# Patient Record
Sex: Male | Born: 1985 | Race: White | Hispanic: No | Marital: Single | Smoking: Current every day smoker
Health system: Southern US, Community
[De-identification: ages and names within clinical notes are randomized; demographics above are authoritative.]

---

## 2005-06-01 ENCOUNTER — Inpatient Hospital Stay: Payer: Self-pay | Admitting: Infectious Diseases

## 2005-06-01 ENCOUNTER — Other Ambulatory Visit: Payer: Self-pay

## 2005-06-04 ENCOUNTER — Inpatient Hospital Stay: Payer: Self-pay | Admitting: Unknown Physician Specialty

## 2005-10-28 ENCOUNTER — Emergency Department: Payer: Self-pay | Admitting: Emergency Medicine

## 2006-01-01 ENCOUNTER — Emergency Department: Payer: Self-pay | Admitting: Emergency Medicine

## 2006-03-05 ENCOUNTER — Emergency Department: Payer: Self-pay | Admitting: Emergency Medicine

## 2006-11-11 ENCOUNTER — Emergency Department: Payer: Self-pay | Admitting: Emergency Medicine

## 2007-05-06 ENCOUNTER — Ambulatory Visit: Payer: Self-pay | Admitting: Emergency Medicine

## 2007-06-28 ENCOUNTER — Emergency Department: Payer: Self-pay | Admitting: Emergency Medicine

## 2008-12-08 ENCOUNTER — Emergency Department: Payer: Self-pay | Admitting: Emergency Medicine

## 2009-05-12 ENCOUNTER — Emergency Department: Payer: Self-pay | Admitting: Emergency Medicine

## 2011-01-21 ENCOUNTER — Emergency Department: Payer: Self-pay | Admitting: Emergency Medicine

## 2011-07-06 ENCOUNTER — Inpatient Hospital Stay: Payer: Self-pay | Admitting: Psychiatry

## 2012-03-08 ENCOUNTER — Emergency Department: Payer: Self-pay | Admitting: Emergency Medicine

## 2012-03-08 LAB — COMPREHENSIVE METABOLIC PANEL
Alkaline Phosphatase: 75 U/L (ref 50–136)
Anion Gap: 7 (ref 7–16)
BUN: 12 mg/dL (ref 7–18)
Bilirubin,Total: 0.6 mg/dL (ref 0.2–1.0)
Calcium, Total: 9.1 mg/dL (ref 8.5–10.1)
Chloride: 108 mmol/L — ABNORMAL HIGH (ref 98–107)
Co2: 28 mmol/L (ref 21–32)
EGFR (African American): >60
Glucose: 82 mg/dL (ref 65–99)
Osmolality: 284 (ref 275–301)
SGPT (ALT): 185 U/L — ABNORMAL HIGH
Sodium: 143 mmol/L (ref 136–145)

## 2012-03-08 LAB — DRUG SCREEN, URINE
Amphetamines, Ur Screen: NEGATIVE (ref ?–1000)
Barbiturates, Ur Screen: NEGATIVE (ref ?–200)
Benzodiazepine, Ur Scrn: POSITIVE (ref ?–200)
Cannabinoid 50 Ng, Ur ~~LOC~~: POSITIVE (ref ?–50)
MDMA (Ecstasy)Ur Screen: NEGATIVE (ref ?–500)
Opiate, Ur Screen: NEGATIVE (ref ?–300)
Phencyclidine (PCP) Ur S: NEGATIVE (ref ?–25)
Tricyclic, Ur Screen: NEGATIVE (ref ?–1000)

## 2012-03-08 LAB — URINALYSIS, COMPLETE
Bacteria: NONE SEEN
Bilirubin,UR: NEGATIVE
Glucose,UR: NEGATIVE mg/dL (ref 0–75)
Ketone: NEGATIVE
Ph: 7 (ref 4.5–8.0)
RBC,UR: 3 /HPF (ref 0–5)
Squamous Epithelial: NONE SEEN
WBC UR: 1 /HPF (ref 0–5)

## 2012-03-08 LAB — SALICYLATE LEVEL: Salicylates, Serum: <1.7 mg/dL

## 2012-03-08 LAB — ETHANOL: Ethanol: <3 mg/dL

## 2012-03-08 LAB — ACETAMINOPHEN LEVEL: Acetaminophen: <2 ug/mL

## 2012-03-08 LAB — CBC
HCT: 41.6 % (ref 40.0–52.0)
MCH: 31.7 pg (ref 26.0–34.0)
MCHC: 34.1 g/dL (ref 32.0–36.0)

## 2012-05-24 ENCOUNTER — Emergency Department: Payer: Self-pay | Admitting: Emergency Medicine

## 2012-05-24 LAB — COMPREHENSIVE METABOLIC PANEL
Albumin: 4.3 g/dL (ref 3.4–5.0)
Alkaline Phosphatase: 65 U/L (ref 50–136)
Bilirubin,Total: 0.4 mg/dL (ref 0.2–1.0)
Calcium, Total: 9.3 mg/dL (ref 8.5–10.1)
Co2: 30 mmol/L (ref 21–32)
EGFR (Non-African Amer.): >60
Glucose: 112 mg/dL — ABNORMAL HIGH (ref 65–99)
Osmolality: 285 (ref 275–301)
SGOT(AST): 76 U/L — ABNORMAL HIGH (ref 15–37)
SGPT (ALT): 105 U/L — ABNORMAL HIGH
Sodium: 142 mmol/L (ref 136–145)

## 2012-05-24 LAB — URINALYSIS, COMPLETE
Ketone: NEGATIVE
Nitrite: NEGATIVE
Ph: 5 (ref 4.5–8.0)
Protein: NEGATIVE
RBC,UR: 2 /HPF (ref 0–5)
Squamous Epithelial: <1

## 2012-05-24 LAB — DRUG SCREEN, URINE
Amphetamines, Ur Screen: NEGATIVE (ref ?–1000)
Benzodiazepine, Ur Scrn: NEGATIVE (ref ?–200)
Cannabinoid 50 Ng, Ur ~~LOC~~: POSITIVE (ref ?–50)
Methadone, Ur Screen: NEGATIVE (ref ?–300)
Opiate, Ur Screen: NEGATIVE (ref ?–300)
Phencyclidine (PCP) Ur S: NEGATIVE (ref ?–25)
Tricyclic, Ur Screen: NEGATIVE (ref ?–1000)

## 2012-05-24 LAB — CBC
HCT: 41.3 % (ref 40.0–52.0)
HGB: 13.9 g/dL (ref 13.0–18.0)
MCH: 31.5 pg (ref 26.0–34.0)
MCHC: 33.7 g/dL (ref 32.0–36.0)
Platelet: 185 10*3/uL (ref 150–440)
RDW: 12.5 % (ref 11.5–14.5)
WBC: 7.6 10*3/uL (ref 3.8–10.6)

## 2012-05-24 LAB — TSH: Thyroid Stimulating Horm: 1.22 u[IU]/mL

## 2012-05-24 LAB — ETHANOL: Ethanol %: <0.003 % (ref 0.000–0.080)

## 2013-11-24 ENCOUNTER — Emergency Department: Payer: Self-pay | Admitting: Emergency Medicine

## 2013-11-24 LAB — ETHANOL

## 2013-11-24 LAB — URINALYSIS, COMPLETE
Bacteria: NONE SEEN
Bilirubin,UR: NEGATIVE
Blood: NEGATIVE
Glucose,UR: NEGATIVE mg/dL (ref 0–75)
KETONE: NEGATIVE
Nitrite: NEGATIVE
PH: 7 (ref 4.5–8.0)
Protein: NEGATIVE
Specific Gravity: 1.021 (ref 1.003–1.030)
Squamous Epithelial: NONE SEEN
WBC UR: 51 /HPF (ref 0–5)

## 2013-11-24 LAB — COMPREHENSIVE METABOLIC PANEL
ALBUMIN: 4 g/dL (ref 3.4–5.0)
ALT: 38 U/L (ref 12–78)
AST: 31 U/L (ref 15–37)
Alkaline Phosphatase: 69 U/L
Anion Gap: 4 — ABNORMAL LOW (ref 7–16)
BUN: 14 mg/dL (ref 7–18)
Bilirubin,Total: 0.4 mg/dL (ref 0.2–1.0)
CHLORIDE: 108 mmol/L — AB (ref 98–107)
CREATININE: 0.76 mg/dL (ref 0.60–1.30)
Calcium, Total: 8.6 mg/dL (ref 8.5–10.1)
Co2: 25 mmol/L (ref 21–32)
EGFR (African American): >60
EGFR (Non-African Amer.): >60
GLUCOSE: 93 mg/dL (ref 65–99)
Osmolality: 274 (ref 275–301)
Potassium: 4 mmol/L (ref 3.5–5.1)
Sodium: 137 mmol/L (ref 136–145)
Total Protein: 7.4 g/dL (ref 6.4–8.2)

## 2013-11-24 LAB — DRUG SCREEN, URINE
AMPHETAMINES, UR SCREEN: NEGATIVE (ref ?–1000)
Barbiturates, Ur Screen: NEGATIVE (ref ?–200)
Benzodiazepine, Ur Scrn: NEGATIVE (ref ?–200)
Cannabinoid 50 Ng, Ur ~~LOC~~: POSITIVE (ref ?–50)
Cocaine Metabolite,Ur ~~LOC~~: POSITIVE (ref ?–300)
MDMA (ECSTASY) UR SCREEN: NEGATIVE (ref ?–500)
Methadone, Ur Screen: NEGATIVE (ref ?–300)
Opiate, Ur Screen: NEGATIVE (ref ?–300)
Phencyclidine (PCP) Ur S: NEGATIVE (ref ?–25)
Tricyclic, Ur Screen: NEGATIVE (ref ?–1000)

## 2013-11-24 LAB — CBC
HCT: 42.5 % (ref 40.0–52.0)
HGB: 14.9 g/dL (ref 13.0–18.0)
MCH: 31.6 pg (ref 26.0–34.0)
MCHC: 34.9 g/dL (ref 32.0–36.0)
MCV: 91 fL (ref 80–100)
Platelet: 224 10*3/uL (ref 150–440)
RBC: 4.7 10*6/uL (ref 4.40–5.90)
RDW: 13 % (ref 11.5–14.5)
WBC: 12.2 10*3/uL — ABNORMAL HIGH (ref 3.8–10.6)

## 2013-11-24 LAB — ACETAMINOPHEN LEVEL

## 2013-11-24 LAB — TROPONIN I

## 2013-11-24 LAB — SALICYLATE LEVEL: Salicylates, Serum: 2.6 mg/dL

## 2014-06-28 ENCOUNTER — Emergency Department: Payer: Self-pay | Admitting: Emergency Medicine

## 2014-07-10 ENCOUNTER — Emergency Department: Payer: Self-pay | Admitting: Emergency Medicine

## 2016-02-28 ENCOUNTER — Emergency Department
Admission: EM | Admit: 2016-02-28 | Discharge: 2016-02-29 | Disposition: A | Discharge status: Home / Self Care | Payer: Self-pay | Attending: Emergency Medicine | Admitting: Emergency Medicine

## 2016-02-28 DIAGNOSIS — T401X1A Poisoning by heroin, accidental (unintentional), initial encounter: Secondary | ICD-10-CM | POA: Insufficient documentation

## 2016-02-28 NOTE — ED Provider Notes (Signed)
[facility] Emergency Department Provider Note  ____________________________________________  Time seen: 1:55 PM  I have reviewed the triage vital signs and the nursing notes.   HISTORY  Chief Complaint No chief complaint on file.      HPI Ronald Wood is a 30 y.o. male [place] via EMS [facility] [place] with presumed heroin overdose. Patient admits to using IV heroin tonight per EMS they were called for altered mental status.    Past Medical history Opiate abuse There are no active problems to display for this patient.   Past surgical history None No current outpatient prescriptions on file.  Allergies No known drug allergies No family history on file.  Social History Social History  Substance Use Topics  . Smoking status: Not on file  . Smokeless tobacco: Not on file  . Alcohol Use: Not on file    Review of Systems  Constitutional: Negative for fever. Eyes: Negative for visual changes. ENT: Negative for sore throat. Cardiovascular: Negative for chest pain. Respiratory: Negative for shortness of breath. Gastrointestinal: Negative for abdominal pain, vomiting and diarrhea. Genitourinary: Negative for dysuria. Musculoskeletal: Negative for back pain. Skin: Negative for rash. Neurological: Negative for headaches, focal weakness or numbness. Psychiatric:Positive for opiate abuse  10-point ROS otherwise negative.  ____________________________________________   PHYSICAL EXAM:  VITAL SIGNS: ED Triage Vitals  Enc Vitals Group     BP --      Pulse --      Resp --      Temp --      Temp src --      SpO2 --      Weight --      Height --      Head Cir --      Peak Flow --      Pain Score --      Pain Loc --      Pain Edu? --      Excl. in GC? --      Constitutional: Somnolent but easily arousable Eyes: Conjunctivae are normal. PERRL. Normal extraocular movements. ENT   Head: Normocephalic and atraumatic.    Nose: No congestion/rhinnorhea.   Mouth/Throat: Mucous membranes are moist.   Neck: No stridor. Hematological/Lymphatic/Immunilogical: No cervical lymphadenopathy. Cardiovascular: Normal rate, regular rhythm. Normal and symmetric distal pulses are present in all extremities. No murmurs, rubs, or gallops. Respiratory: Normal respiratory effort without tachypnea nor retractions. Breath sounds are clear and equal bilaterally. No wheezes/rales/rhonchi. Gastrointestinal: Soft and nontender. No distention. There is no CVA tenderness. Genitourinary: deferred Musculoskeletal: Nontender with normal range of motion in all extremities. No joint effusions.  No lower extremity tenderness nor edema. Neurologic:  Normal speech and language. No gross focal neurologic deficits are appreciated. Speech is normal.  Skin:  Skin is warm, dry and intact. No rash noted.    INITIAL IMPRESSION / ASSESSMENT AND PLAN / ED COURSE  Pertinent labs & imaging results that were available during my care of the patient were reviewed by me and considered in my medical decision making (see chart for details).  Patient maintaining oxygen saturation at 97% respiratory rate at this time 16. As a result of this patient did not receive Narcan  ____________________________________________   FINAL CLINICAL IMPRESSION(S) / ED DIAGNOSES  Final diagnoses:  Heroin overdose, accidental or unintentional, initial encounter      Darci Currentandolph N Omri Bertran, MD 02/29/16 [phone]

## 2016-02-29 ENCOUNTER — Encounter: Payer: Self-pay | Admitting: Emergency Medicine

## 2016-02-29 NOTE — Discharge Instructions (Signed)
Narcotic Overdose °A narcotic overdose is the misuse or overuse of a narcotic drug. A narcotic overdose can make you pass out and stop breathing. If you are not treated right away, this can cause permanent brain damage or stop your heart. Medicine may be given to reverse the effects of an overdose. If so, this medicine may bring on withdrawal symptoms. The symptoms may be abdominal cramps, throwing up (vomiting), sweating, chills, and nervousness. °Injecting narcotics can cause more problems than just an overdose. AIDS, hepatitis, and other very serious infections are transmitted by sharing needles and syringes. If you decide to quit using, there are medicines which can help you through the withdrawal period. Trying to quit all at once on your own can be uncomfortable, but not life-threatening. Call your caregiver, Narcotics Anonymous, or any drug and alcohol treatment program for further help.  °  °This information is not intended to replace advice given to you by your health care provider. Make sure you discuss any questions you have with your health care provider. °  °Document Released: 12/11/2004 Document Revised: 11/24/2014 Document Reviewed: 04/25/2015 °Elsevier Interactive Patient Education ©2016 [employer] ° °

## 2016-02-29 NOTE — ED Notes (Signed)
Pt woke and started verbally aggressive, using F word and saying he did not need to be here. Pt informed that he was altered and not mentaining O2 saturation well when he arrived here in the ED. Continued to curse then rush out the ED.

## 2016-02-29 NOTE — ED Notes (Signed)
Pt found disoriented, states overdose on heroin.

## 2018-07-30 ENCOUNTER — Emergency Department
Admission: EM | Admit: 2018-07-30 | Discharge: 2018-07-30 | Discharge status: Against Medical Advice | Payer: Managed Care, Other (non HMO) | Attending: Emergency Medicine | Admitting: Emergency Medicine

## 2018-07-30 DIAGNOSIS — F15129 Other stimulant abuse with intoxication, unspecified: Secondary | ICD-10-CM | POA: Insufficient documentation

## 2018-07-30 NOTE — ED Provider Notes (Signed)
-----------------------------------------   12:50 PM on 07/30/2018 -----------------------------------------  I saw the patient in triage as he was threatening to leave.  I spoke to the patient, he does admit to substance use disorder.  However he clearly denies any suicidal homicidal thoughts.  Patient did state 2 days ago he swallowed approximately 2 g of methamphetamine.  Discussed this with him as well as it was in a plastic bag that we would recommend being medically evaluated.  Patient states he understands but does not want to be seen.  Does not want to come into the hospital.  Denies SI or HI and states he is going to leave.  Discussed this with the father as well, father states they have been going through this for a long time and he was initially agreeable to come in for treatment but is now saying that he is no longer agreeable.  As the patient denies SI or HI he does not currently meet commitment criteria and he is free to leave without a proper examination.   Minna AntisPaduchowski, Enolia Koepke, MD 07/30/18 1252

## 2018-07-30 NOTE — ED Notes (Signed)
MD Paduchowski called to Triage for eval. Pt stating he wants to leave but father feels he needs to stay. Pt states he wants to leave and enter a detox program on his own. Pt chose to leave.

## 2018-07-30 NOTE — ED Triage Notes (Signed)
Pt to ED with c/o of anxiety. Pt states he self medicates with opiates but states he has not self medicated in days . Pt denies SI/HI. Pt admits to ingesting a bag of meth 2 days ago in order to hide it.

## 2018-07-30 NOTE — ED Triage Notes (Signed)
First Nurse Note:  Arrives with father for "psych evaluation".  Patient's father states he using Meth.

## 2018-08-24 ENCOUNTER — Emergency Department: Payer: Managed Care, Other (non HMO)

## 2018-08-24 ENCOUNTER — Emergency Department
Admission: EM | Admit: 2018-08-24 | Discharge: 2018-08-25 | Disposition: A | Discharge status: Home / Self Care | Payer: Managed Care, Other (non HMO) | Attending: Emergency Medicine | Admitting: Emergency Medicine

## 2018-08-24 ENCOUNTER — Other Ambulatory Visit: Payer: Self-pay

## 2018-08-24 DIAGNOSIS — F1510 Other stimulant abuse, uncomplicated: Secondary | ICD-10-CM

## 2018-08-24 DIAGNOSIS — R442 Other hallucinations: Secondary | ICD-10-CM | POA: Insufficient documentation

## 2018-08-24 DIAGNOSIS — F15122 Other stimulant abuse with intoxication with perceptual disturbance: Secondary | ICD-10-CM | POA: Insufficient documentation

## 2018-08-24 LAB — COMPREHENSIVE METABOLIC PANEL
ALBUMIN: 5.1 g/dL — AB (ref 3.5–5.0)
ALT: 81 U/L — AB (ref 0–44)
AST: 46 U/L — AB (ref 15–41)
Alkaline Phosphatase: 56 U/L (ref 38–126)
Anion gap: 11 (ref 5–15)
BUN: 21 mg/dL — AB (ref 6–20)
CO2: 25 mmol/L (ref 22–32)
CREATININE: 0.88 mg/dL (ref 0.61–1.24)
Calcium: 9.7 mg/dL (ref 8.9–10.3)
Chloride: 103 mmol/L (ref 98–111)
GFR calc Af Amer: >60 mL/min (ref >60)
GFR calc non Af Amer: >60 mL/min (ref >60)
GLUCOSE: 105 mg/dL — AB (ref 70–99)
Potassium: 3.6 mmol/L (ref 3.5–5.1)
Sodium: 139 mmol/L (ref 135–145)
Total Bilirubin: 1.3 mg/dL — ABNORMAL HIGH (ref 0.3–1.2)
Total Protein: 8.1 g/dL (ref 6.5–8.1)

## 2018-08-24 LAB — CBC
HEMATOCRIT: 43.7 % (ref 39.0–52.0)
Hemoglobin: 15.1 g/dL (ref 13.0–17.0)
MCH: 31.5 pg (ref 26.0–34.0)
MCHC: 34.6 g/dL (ref 30.0–36.0)
MCV: 91 fL (ref 80.0–100.0)
NRBC: 0 % (ref 0.0–0.2)
Platelets: 272 10*3/uL (ref 150–400)
RBC: 4.8 MIL/uL (ref 4.22–5.81)
RDW: 13 % (ref 11.5–15.5)
WBC: 10.2 10*3/uL (ref 4.0–10.5)

## 2018-08-24 LAB — CK: Total CK: 224 U/L (ref 49–397)

## 2018-08-24 LAB — TROPONIN I

## 2018-08-24 LAB — LIPASE, BLOOD: Lipase: 25 U/L (ref 11–51)

## 2018-08-24 MED ORDER — ALBUTEROL SULFATE (2.5 MG/3ML) 0.083% IN NEBU
5.0000 mg | INHALATION_SOLUTION | Freq: Once | RESPIRATORY_TRACT | Status: AC
  Administered 2018-08-24: 5 mg via RESPIRATORY_TRACT
  Filled 2018-08-24: qty 6

## 2018-08-24 MED ORDER — ALBUTEROL SULFATE (2.5 MG/3ML) 0.083% IN NEBU: 2.5000 mg | INHALATION_SOLUTION | Freq: Once | RESPIRATORY_TRACT | Status: DC

## 2018-08-24 MED ORDER — SODIUM CHLORIDE 0.9 % IV SOLN
1000.0000 mL | Freq: Once | INTRAVENOUS | Status: AC
  Administered 2018-08-24: 1000 mL via INTRAVENOUS

## 2018-08-24 MED ORDER — LORAZEPAM 2 MG/ML IJ SOLN
2.0000 mg | Freq: Once | INTRAMUSCULAR | Status: AC
  Administered 2018-08-24: 2 mg via INTRAVENOUS
  Filled 2018-08-24: qty 1

## 2018-08-24 NOTE — ED Notes (Signed)
Pt denies pain. Remains drowsy but is more easily woken than previously.

## 2018-08-24 NOTE — ED Notes (Signed)
Pt sleeping. NSR w/ unifocal PVCs. VSS.

## 2018-08-24 NOTE — ED Notes (Signed)
Pt arrived to ED reporting he ate 3 grams of "ICE". RN asked what that was and pt continued to state, "I am in trouble, I have to go to the bathroom" pt abe to ambulate to the bathroom and when medical staff checked on pt he was attempting to vomit. Pt able to talk at this time but wanting to stay in the bathroom at this time.   Pt unwilling to answer further questions at this time.

## 2018-08-24 NOTE — ED Triage Notes (Signed)
Pt reports having swallowed 3.5g of Meth while walking down the street. Pt reports he chewed the bag to help him swallow it. Pt is diaphoretic and reports he is seeing bugs crawl on him. Pt able to tlk without difficulty and ambulate independently.

## 2018-08-24 NOTE — ED Provider Notes (Signed)
Patient received in sign-out from Dr. Cyril Loosen.  Workup and evaluation pending observation in the ER.  Patient has been observed in the ER for 6 hours is now clinically sober.  Stable and appropriate for outpatient follow-up.       Willy Eddy, MD 08/24/18 [phone]

## 2018-08-24 NOTE — ED Provider Notes (Signed)
[facility] Emergency Department Provider Note   ____________________________________________    I have reviewed the triage vital signs and the nursing notes.   HISTORY  Chief Complaint Drug Overdose  History limited by substance use   HPI Ronald Wood is a 32 y.o. male who states that he was getting high on methamphetamine and then states that he became paranoid and decided to chew up the rest of his "stash "he states he chewed up 3 bags but unclear what the quantity was.  He states that typically he smokes meth but he was worried that someone would catch him so he chewed up his supply.  Complains of mild epigastric discomfort, he states he is seeing bugs on his body.   No past medical history on file.  There are no active problems to display for this patient.   No past surgical history on file.  Prior to Admission medications   Not on File     Allergies Patient has no known allergies.  No family history on file.  Social History Social History   Tobacco Use  . Smoking status: Unknown If Ever Smoked  Substance Use Topics  . Alcohol use: Not on file  . Drug use: Not on file    Review of Systems  Constitutional: No dizziness Eyes: No visual changes.  ENT: No neck pain Cardiovascular: No chest pain Respiratory: Denies shortness of breath. Gastrointestinal: As above Genitourinary: Negative for dysuria. Musculoskeletal: Negative for back pain. Skin: Negative for rash. Neurological: Negative for headaches    ____________________________________________   PHYSICAL EXAM:  VITAL SIGNS: ED Triage Vitals  Enc Vitals Group     BP 08/24/18 1258 129/86     Pulse Rate 08/24/18 1258 (!) 125     Resp --      Temp 08/24/18 1258 98.4 F (36.9 C)     Temp Source 08/24/18 1258 Oral     SpO2 08/24/18 1258 97 %     Weight 08/24/18 1257 68 kg (150 lb)     Height 08/24/18 1257 2.108 m (6\' 11" )     Head Circumference --    Peak Flow --      Pain Score 08/24/18 1313 0     Pain Loc --      Pain Edu? --      Excl. in GC? --     Constitutional: Alert, confused Eyes: Conjunctivae are normal.  Head: Atraumatic. Nose: No congestion/rhinnorhea. Mouth/Throat: Mucous membranes are moist.   Neck:  Painless ROM Cardiovascular: Tachycardia, regular rhythm. Grossly normal heart sounds.  Good peripheral circulation. Respiratory: Normal respiratory effort.  No retractions.  Clear to auscultation bilaterally Gastrointestinal: Soft and nontender. No distention.    Musculoskeletal: No lower extremity tenderness nor edema.  Warm and well perfused Neurologic:  No gross focal neurologic deficits are appreciated.  Skin:  Skin is warm, dry and intact. No rash noted.   ____________________________________________   LABS (all labs ordered are listed, but only abnormal results are displayed)  Labs Reviewed  COMPREHENSIVE METABOLIC PANEL - Abnormal; Notable for the following components:      Result Value   Glucose, Bld 105 (*)    BUN 21 (*)    Albumin 5.1 (*)    AST 46 (*)    ALT 81 (*)    Total Bilirubin 1.3 (*)    All other components within normal limits  CK  CBC  TROPONIN I  LIPASE, BLOOD   ____________________________________________  EKG  ED ECG REPORT I, Jene Every, the attending physician, personally viewed and interpreted this ECG.  Date: 08/24/2018  Rhythm: Sinus tachycardia QRS Axis: normal Intervals: normal ST/T Wave abnormalities: normal Narrative Interpretation: no evidence of acute ischemia  ____________________________________________  RADIOLOGY  Cxr, kub ____________________________________________   PROCEDURES  Procedure(s) performed: No  Procedures   Critical Care performed: No ____________________________________________   INITIAL IMPRESSION / ASSESSMENT AND PLAN / ED COURSE  Pertinent labs & imaging results that were available during my care of the patient were  reviewed by me and considered in my medical decision making (see chart for details).  Patient presents after methamphetamine ingestion, apparently he is both smoked and chewed up methamphetamine.  He is tachycardic, slightly altered and is having visual hallucinations.  I ordered 2 mg of IV Ativan, IV fluids, will check labs including CK, troponin.  EKG is reassuring.  Pending x-rays chest and abdomen.   Lab work is reassuring, patient's heart rate has improved.  We will continue to monitor    ____________________________________________   FINAL CLINICAL IMPRESSION(S) / ED DIAGNOSES  Final diagnoses:  Amphetamine abuse (HCC)        Note:  This document was prepared using Dragon voice recognition software and may include unintentional dictation errors.    Jene Every, MD 08/24/18 1359

## 2018-08-24 NOTE — ED Notes (Signed)
Radiology at bedside. Patient resting much more comfortably at this time. Remains on monitor.

## 2018-08-24 NOTE — ED Notes (Signed)
Pt will open eyes to command/touch. Attempts to obey commands but is still drowsy.

## 2018-08-24 NOTE — ED Notes (Signed)
Patient pulling at clothing. Patient states "Look at this. This shouldn't be here". Nothing visible to this RN. Patient states "I am sorry I am just seeing stuff".

## 2018-08-25 NOTE — ED Notes (Signed)
Pt c/o some nausea. Offered to get something for nausea before leaving but declined.  Given crackers and juice to have in lobby until mother will get in AM since she will not come get him in middle of night.

## 2018-08-25 NOTE — ED Notes (Addendum)
Pt woke when RN entered room. Oriented X 4. NAD. VSS. Ok for discharge per dr brown. Able to ambulate independently.

## 2018-09-20 ENCOUNTER — Encounter: Payer: Self-pay | Admitting: Emergency Medicine

## 2018-09-20 ENCOUNTER — Emergency Department
Admission: EM | Admit: 2018-09-20 | Discharge: 2018-09-20 | Disposition: A | Discharge status: Home / Self Care | Payer: Managed Care, Other (non HMO) | Attending: Emergency Medicine | Admitting: Emergency Medicine

## 2018-09-20 ENCOUNTER — Other Ambulatory Visit: Payer: Self-pay

## 2018-09-20 DIAGNOSIS — A749 Chlamydial infection, unspecified: Secondary | ICD-10-CM | POA: Insufficient documentation

## 2018-09-20 DIAGNOSIS — F17210 Nicotine dependence, cigarettes, uncomplicated: Secondary | ICD-10-CM | POA: Insufficient documentation

## 2018-09-20 DIAGNOSIS — A549 Gonococcal infection, unspecified: Secondary | ICD-10-CM | POA: Insufficient documentation

## 2018-09-20 LAB — CHLAMYDIA/NGC RT PCR (ARMC ONLY)
Chlamydia Tr: DETECTED — AB
N GONORRHOEAE: DETECTED — AB

## 2018-09-20 MED ORDER — AZITHROMYCIN 500 MG PO TABS
1000.0000 mg | ORAL_TABLET | Freq: Once | ORAL | Status: AC
  Administered 2018-09-20: 1000 mg via ORAL
  Filled 2018-09-20: qty 2

## 2018-09-20 MED ORDER — CEFTRIAXONE SODIUM 250 MG IJ SOLR
250.0000 mg | Freq: Once | INTRAMUSCULAR | Status: AC
  Administered 2018-09-20: 250 mg via INTRAMUSCULAR
  Filled 2018-09-20: qty 250

## 2018-09-20 MED ORDER — METRONIDAZOLE 500 MG PO TABS: 2000.0000 mg | ORAL_TABLET | Freq: Once | ORAL | 0 refills | Status: AC

## 2018-09-20 MED ORDER — METRONIDAZOLE 500 MG PO TABS
2000.0000 mg | ORAL_TABLET | Freq: Once | ORAL | Status: AC
  Administered 2018-09-20: 2000 mg via ORAL
  Filled 2018-09-20: qty 4

## 2018-09-20 NOTE — ED Provider Notes (Signed)
[facility] Emergency Department Provider Note ____________________________________________  Time seen: 2104  I have reviewed the triage vital signs and the nursing notes.  HISTORY  Chief Complaint  SEXUALLY TRANSMITTED DISEASE  HPI Ronald Wood is a 32 y.o. male resents to the ED accompanied by his new sexual partner, for evaluation of dysuria and urethral discharge.  The patient says he feels like he is "pissing razor blades and there is a discharge."  He describes symptoms began a few days earlier.  He denies any fevers, chills, sweats.  He also denies any hematuria but has noted some decreased urine flow.  He also has noticed some tenderness to the inguinal region.  He denies any previous history of STDs.  History reviewed. No pertinent past medical history.  There are no active problems to display for this patient.  History reviewed. No pertinent surgical history.  Prior to Admission medications   Medication Sig Start Date End Date Taking? Authorizing Provider  metroNIDAZOLE (FLAGYL) 500 MG tablet Take 4 tablets (2,000 mg total) by mouth once for 1 dose. 09/20/18 09/20/18  Jakyri Brunkhorst, Charlesetta Ivory, PA-C    Allergies Patient has no known allergies.  History reviewed. No pertinent family history.  Social History Social History   Tobacco Use  . Smoking status: Current Every Day Smoker    Packs/day: 0.50    Types: Cigarettes  Substance Use Topics  . Alcohol use: Yes    Comment: Occ  . Drug use: Not on file    Review of Systems  Constitutional: Negative for fever. Eyes: Negative for visual changes. ENT: Negative for sore throat. Cardiovascular: Negative for chest pain. Respiratory: Negative for shortness of breath. Gastrointestinal: Negative for abdominal pain, vomiting and diarrhea. Genitourinary: Negative for dysuria. Musculoskeletal: Negative for back pain. Skin: Negative for rash. Neurological: Negative for headaches, focal  weakness or numbness. ____________________________________________  PHYSICAL EXAM:  VITAL SIGNS: ED Triage Vitals  Enc Vitals Group     BP 09/20/18 2058 132/81     Pulse Rate 09/20/18 2058 85     Resp 09/20/18 2058 16     Temp 09/20/18 2058 98.1 F (36.7 C)     Temp Source 09/20/18 2058 Oral     SpO2 09/20/18 2058 98 %     Weight 09/20/18 2059 150 lb (68 kg)     Height 09/20/18 2059 6' (1.829 m)     Head Circumference --      Peak Flow --      Pain Score 09/20/18 2058 5     Pain Loc --      Pain Edu? --      Excl. in GC? --     Constitutional: Alert and oriented. Well appearing and in no distress. Head: Normocephalic and atraumatic. Eyes: Conjunctivae are normal. Normal extraocular movements Hematological/Lymphatic/Immunological:  Palpable, tender inguinal lymphadenopathy. Cardiovascular: Normal rate, regular rhythm. Normal distal pulses. Respiratory: Normal respiratory effort. No wheezes/rales/rhonchi. GU: normal circumcised external genitalia. No penile discharge on manipulation. No inguinal hernias. No external lesions noted.  Musculoskeletal: Nontender with normal range of motion in all extremities.  Neurologic:  Normal gait without ataxia. Normal speech and language. No gross focal neurologic deficits are appreciated. Skin:  Skin is warm, dry and intact. No rash noted. ____________________________________________   LABS (pertinent positives/negatives)  Labs Reviewed  CHLAMYDIA/NGC RT PCR ([facility] ONLY) - Abnormal; Notable for the following components:      Result Value   Chlamydia Tr DETECTED (*)    N gonorrhoeae  DETECTED (*)    All other components within normal limits  ____________________________________________  PROCEDURES  Procedures Azithromycin 1 g PO Rocephin 250 mg IM Metronidazole 2 g PO ____________________________________________  INITIAL IMPRESSION / ASSESSMENT AND PLAN / ED COURSE  Patient with ED evaluation of dysuria and penile  discharge.  His labs to confirm peritonitis secondary to gonorrhea and chlamydia.  Patient was treated in the ED with azithromycin, Rocephin, and metronidazole for empiric treatment of trichomoniasis.  Patient and his male companion, or both notified that they should both be treated completely and symptom-free before any intimate contact.  Patient verbalizes understanding of his discharge instructions.  He will follow-up with the health department or [facility] health services as needed. ____________________________________________  FINAL CLINICAL IMPRESSION(S) / ED DIAGNOSES  Final diagnoses:  Gonorrhea  Chlamydia      Karmen Stabs, Charlesetta Ivory, PA-C 09/20/18 2327    Minna Antis, MD 09/21/18 2358

## 2018-09-20 NOTE — Discharge Instructions (Signed)
You have been tested and treated for gonorrhea and chlamydia. Take the additional medicine for empiric treatment of trichomoniasis. Follow-up with the [facility] Department for additional STD testing. Avoid sexual contact with your partner(s) need to be treated and symptom-free, before any further contact.

## 2018-09-20 NOTE — ED Triage Notes (Signed)
Pt reports that he feels like he is "pissing razor blades and there is a discharge." He states that he feels a knot where he urinates also.

## 2019-01-22 ENCOUNTER — Emergency Department
Admission: EM | Admit: 2019-01-22 | Discharge: 2019-01-22 | Discharge status: Against Medical Advice | Payer: Managed Care, Other (non HMO)

## 2019-01-22 ENCOUNTER — Other Ambulatory Visit: Payer: Self-pay

## 2019-01-22 ENCOUNTER — Emergency Department
Admission: EM | Admit: 2019-01-22 | Discharge: 2019-01-23 | Disposition: A | Discharge status: Home / Self Care | Payer: Managed Care, Other (non HMO) | Attending: Emergency Medicine | Admitting: Emergency Medicine

## 2019-01-22 DIAGNOSIS — F1120 Opioid dependence, uncomplicated: Secondary | ICD-10-CM | POA: Insufficient documentation

## 2019-01-22 DIAGNOSIS — Z113 Encounter for screening for infections with a predominantly sexual mode of transmission: Secondary | ICD-10-CM | POA: Insufficient documentation

## 2019-01-22 DIAGNOSIS — F17210 Nicotine dependence, cigarettes, uncomplicated: Secondary | ICD-10-CM | POA: Insufficient documentation

## 2019-01-22 NOTE — ED Notes (Signed)
Pt. Requested to use phone to call mother and let her know he is here.  Phone # dialed [phone].

## 2019-01-22 NOTE — ED Notes (Signed)
Pt. States scratches on arm appeared for no reason.  Pt. Has what look like an abscess to lower lt. leg extremity.  Pt. Mumbles about how family and girlfriend do not understand him.  Pt. States he uses suboxone, unsure on last use.

## 2019-01-22 NOTE — ED Triage Notes (Addendum)
Pt now returned in ED. Informed pt he missed triage again and needs to remain in building if wants to be checked in.

## 2019-01-22 NOTE — ED Notes (Signed)
Called for triage, no answer

## 2019-01-22 NOTE — ED Notes (Signed)
Pt. Wanted me to talk to his mother and gave me permission to discuss medical care.  Mother states patient is a long time heroin user, but has been using suboxone lately.

## 2019-01-22 NOTE — ED Notes (Signed)
Patient called for triage, noted out front. States he is leaving and does not wish to be seen at this time.

## 2019-01-22 NOTE — ED Notes (Signed)
Pt seen walking out front door.

## 2019-01-22 NOTE — ED Triage Notes (Signed)
FIRST NURSE NOTE-here for lesions on leg. Ambulatory, drinking dr pepper. NAD.

## 2019-01-22 NOTE — ED Notes (Signed)
Called for triage no answer  

## 2019-01-22 NOTE — ED Triage Notes (Signed)
Patient reports he has something coming out of his skin "I think someone gave me something."   "I have some mental issues also, I have substance abuse problems, I probably need to detox".  When asked what needs detox from states "pretty much everything".

## 2019-01-22 NOTE — ED Notes (Signed)
Pt now up to first nurse desk staring at hands saying something is on his skin.  Pt asking when he will be called. Informed pt he was called twice to get triaged and he did not come.  Pt reports he was in the bathroom. Pt appears to be under the influence of something.  Pt instructed to sit in waiting area for triage and not leave.

## 2019-01-23 NOTE — ED Notes (Signed)
Attempts to draw blood have been unsuccessful.  Lab called for assistance.

## 2019-01-23 NOTE — ED Provider Notes (Signed)
[facility] Emergency Department Provider Note  ____________________________________________  Time seen: Approximately 12:07 AM  I have reviewed the triage vital signs and the nursing notes.   HISTORY  Chief Complaint Rash    HPI Ronald Wood is a 33 y.o. male with past medical history of polysubstance abuse who comes the ED complaining of skin lesions that are concerning to him.  They have been there for the past couple of weeks, denies fever or chills.  Does report that he has been using opiates recently and also Suboxone when he can get some.  He is concerned that he may have been exposed to HIV.  Denies penile discharge.  He is interested in testing for infectious diseases.   Patient denies SI HI or hallucinations.  He is clear that he is not a danger to himself but just wants help for his substance abuse.   Past medical history of polysubstance abuse   There are no active problems to display for this patient.    Past surgical history noncontributory   Prior to Admission medications   Not on File   None  Allergies Patient has no known allergies.   No family history on file.  Social History Social History   Tobacco Use  . Smoking status: Current Every Day Smoker    Packs/day: 0.50    Types: Cigarettes  Substance Use Topics  . Alcohol use: Yes    Comment: Occ  . Drug use: Not on file    Review of Systems  Constitutional:   No fever or chills.  ENT:   No sore throat. No rhinorrhea. Cardiovascular:   No chest pain or syncope. Respiratory:   No dyspnea or cough. Gastrointestinal:   Negative for abdominal pain, vomiting and diarrhea.  Musculoskeletal:   Negative for focal pain or swelling All other systems reviewed and are negative except as documented above in ROS and HPI.  ____________________________________________   PHYSICAL EXAM:  VITAL SIGNS: ED Triage Vitals  Enc Vitals Group     BP 01/22/19 2010 114/78   Pulse Rate 01/22/19 2010 (!) 111     Resp 01/22/19 2010 20     Temp 01/22/19 2010 98.1 F (36.7 C)     Temp Source 01/22/19 2010 Oral     SpO2 01/22/19 2010 97 %     Weight 01/22/19 2009 140 lb (63.5 kg)     Height 01/22/19 2009 6' (1.829 m)     Head Circumference --      Peak Flow --      Pain Score 01/22/19 2009 0     Pain Loc --      Pain Edu? --      Excl. in GC? --     Vital signs reviewed, nursing assessments reviewed.   Constitutional:   Alert and oriented. Non-toxic appearance. Eyes:   Conjunctivae are normal. EOMI. PERRL. ENT      Head:   Normocephalic and atraumatic.      Nose:   No congestion/rhinnorhea.       Mouth/Throat:   MMM, no pharyngeal erythema. No peritonsillar mass.       Neck:   No meningismus. Full ROM. Hematological/Lymphatic/Immunilogical:   No cervical lymphadenopathy. Cardiovascular:   RRR. Symmetric bilateral radial and DP pulses.  No murmurs. Cap refill less than 2 seconds. Respiratory:   Normal respiratory effort without tachypnea/retractions. Breath sounds are clear and equal bilaterally. No wheezes/rales/rhonchi. Gastrointestinal:   Soft and nontender. Non distended. There is no  CVA tenderness.  No rebound, rigidity, or guarding.  Musculoskeletal:   Normal range of motion in all extremities. No joint effusions.  No lower extremity tenderness.  No edema.  No evidence of S STI Neurologic:   Normal speech and language.  Motor grossly intact. No acute focal neurologic deficits are appreciated.  Skin:    Skin is warm, dry with a 1 cm scab on the left shin, another 1 on the left mid thorax, and 3 or 4 superficial linear lacerations on the right volar forearm.  No rash noted.  No petechiae, purpura, or bullae.  ____________________________________________    LABS (pertinent positives/negatives) (all labs ordered are listed, but only abnormal results are displayed) Labs Reviewed  CHLAMYDIA/NGC RT PCR ([facility] ONLY)  RPR  HIV ANTIBODY  (ROUTINE TESTING W REFLEX)  URINALYSIS, COMPLETE ([facility]MP) WITH MICROSCOPIC  ACETAMINOPHEN LEVEL  SALICYLATE LEVEL  URINE DRUG SCREEN, QUALITATIVE ([facility] ONLY)  COMPREHENSIVE METABOLIC PANEL  ETHANOL  CBC WITH DIFFERENTIAL/PLATELET   ____________________________________________   EKG    ____________________________________________    RADIOLOGY  No results found.  ____________________________________________   PROCEDURES Procedures  ____________________________________________    CLINICAL IMPRESSION / ASSESSMENT AND PLAN / ED COURSE  Medications ordered in the ED: Medications - No data to display  Pertinent labs & imaging results that were available during my care of the patient were reviewed by me and considered in my medical decision making (see chart for details).    Patient is nontoxic, presents for help with his substance abuse.  Also concerned about multiple skin findings which are benign in appearance and not consistent with cellulitis abscess or necrotizing fasciitis.  No evidence of tick or other significant insect bites or envenomation.  Attempted to initiate a work-up for HIV syphilis and STI along with checking labs for potential detox referral, but after failed attempts by nursing to obtain samples, patient stated he wanted to be discharged from the emergency department immediately.  He is medically and psychiatrically stable and has medical decision-making capacity was discharged per his instructions.      ____________________________________________   FINAL CLINICAL IMPRESSION(S) / ED DIAGNOSES    Final diagnoses:  Uncomplicated opioid dependence [facility])     ED Discharge Orders    None      Portions of this note were generated with dragon dictation software. Dictation errors may occur despite best attempts at proofreading.   Sharman Cheek, MD 01/23/19 0010

## 2019-01-23 NOTE — ED Notes (Signed)
Pt. Escorted to lobby by security

## 2019-01-23 NOTE — ED Notes (Signed)
Pt. Stated he did not want to stay any longer, that he needed to go.

## 2019-01-23 NOTE — ED Notes (Signed)
Pt. Stated he did not want to wait for discharge paperwork.  Pt. States "I want to smoke a cigarette.  Pt. Told we could offer nicotine patch.  Pt. States "I have skin issues, I don't want no patch".  Pt. Yelling, security escorted pt. To lobby.

## 2019-02-10 ENCOUNTER — Emergency Department
Admission: EM | Admit: 2019-02-10 | Discharge: 2019-02-10 | Disposition: A | Discharge status: Home / Self Care | Payer: Managed Care, Other (non HMO) | Attending: Emergency Medicine | Admitting: Emergency Medicine

## 2019-02-10 ENCOUNTER — Other Ambulatory Visit: Payer: Self-pay

## 2019-02-10 DIAGNOSIS — F1120 Opioid dependence, uncomplicated: Secondary | ICD-10-CM | POA: Insufficient documentation

## 2019-02-10 DIAGNOSIS — Y9289 Other specified places as the place of occurrence of the external cause: Secondary | ICD-10-CM | POA: Insufficient documentation

## 2019-02-10 DIAGNOSIS — L989 Disorder of the skin and subcutaneous tissue, unspecified: Secondary | ICD-10-CM

## 2019-02-10 DIAGNOSIS — Y998 Other external cause status: Secondary | ICD-10-CM | POA: Insufficient documentation

## 2019-02-10 DIAGNOSIS — F1920 Other psychoactive substance dependence, uncomplicated: Secondary | ICD-10-CM

## 2019-02-10 DIAGNOSIS — W25XXXA Contact with sharp glass, initial encounter: Secondary | ICD-10-CM | POA: Insufficient documentation

## 2019-02-10 DIAGNOSIS — S0101XA Laceration without foreign body of scalp, initial encounter: Secondary | ICD-10-CM

## 2019-02-10 DIAGNOSIS — Y9389 Activity, other specified: Secondary | ICD-10-CM | POA: Insufficient documentation

## 2019-02-10 DIAGNOSIS — S0990XA Unspecified injury of head, initial encounter: Secondary | ICD-10-CM | POA: Insufficient documentation

## 2019-02-10 MED ORDER — LORAZEPAM 1 MG PO TABS
1.0000 mg | ORAL_TABLET | Freq: Once | ORAL | Status: AC
  Administered 2019-02-10: 1 mg via ORAL
  Filled 2019-02-10: qty 1

## 2019-02-10 NOTE — ED Triage Notes (Signed)
Pt arrived via EMS [facility] with complaints of a fall on Monday. Pt states that he fell and he feels as if there is still glass in his head. Pt has Hx of Heroin, meth, and Hep C. Pt takes suboxone and reports chills and bugs crawling all over his skin. VS per EMS temp 97.7 BP-123/83. Pt alert and oriented x 4.

## 2019-02-10 NOTE — ED Provider Notes (Signed)
[facility] Emergency Department Provider Note   ____________________________________________   First MD Initiated Contact with Patient 02/10/19 0401     (approximate)  I have reviewed the triage vital signs and the nursing notes.   HISTORY  Chief Complaint Skin sensation   HPI Ronald Wood is a 33 y.o. male who presents to the ED via EMS from local motel with a chief complaint of feeling like bugs are crawling over his skin.  Patient has a history of polysubstance abuse, admits to taking Suboxone, heroin and Percocet off the street.  States he has been having skin changes with multiple lesions and abrasions without remembering how he got them.  Also states he hit his head against a glass entertainment center 3 days ago and is concerned there is glass in his head.  Denies LOC.  Denies fever, chills, chest pain, shortness of breath, abdominal pain, nausea or vomiting.  Denies recent travel.  Denies exposure to persons diagnosed with coronavirus.  Denies active SI/HI/AH/VH.       Past medical history Polysubstance abuse   There are no active problems to display for this patient.   History reviewed. No pertinent surgical history.  Prior to Admission medications   Not on File    Allergies Patient has no known allergies.  History reviewed. No pertinent family history.  Social History Social History   Tobacco Use  . Smoking status: Current Every Day Smoker    Packs/day: 0.50    Types: Cigarettes  Substance Use Topics  . Alcohol use: Yes    Comment: Occ  . Drug use: Yes    Types: Methamphetamines    Comment: heroin    Review of Systems  Constitutional: No fever/chills Eyes: No visual changes. ENT: No sore throat. Cardiovascular: Denies chest pain. Respiratory: Denies shortness of breath. Gastrointestinal: No abdominal pain.  No nausea, no vomiting.  No diarrhea.  No constipation. Genitourinary: Negative for dysuria.  Musculoskeletal: Negative for back pain. Skin: Negative for rash. Neurological: Negative for headaches, focal weakness or numbness. Psychiatric:  Positive for substance abuse.   ____________________________________________   PHYSICAL EXAM:  VITAL SIGNS: ED Triage Vitals  Enc Vitals Group     BP      Pulse      Resp      Temp      Temp src      SpO2      Weight      Height      Head Circumference      Peak Flow      Pain Score      Pain Loc      Pain Edu?      Excl. in GC?     Constitutional: Alert and oriented.  Disheveled appearing and in no acute distress. Eyes: Conjunctivae are normal. PERRL. EOMI. Head: Clotted blood to vertex of head.  Will reexamine once cleansed. Nose: No congestion/rhinnorhea. Mouth/Throat: Mucous membranes are moist.  Oropharynx non-erythematous. Neck: No stridor.   Cardiovascular: Normal rate, regular rhythm. Grossly normal heart sounds.  Good peripheral circulation. Respiratory: Normal respiratory effort.  No retractions. Lungs CTAB. Gastrointestinal: Soft and nontender. No distention. No abdominal bruits. No CVA tenderness. Musculoskeletal: No lower extremity tenderness nor edema.  No joint effusions. Neurologic:  Normal speech and language. No gross focal neurologic deficits are appreciated. No gait instability. Skin:  Skin is warm, dry and intact. No rash noted.  Scattered excoriations to all limbs without warmth or erythema.  Abrasions  to left anterior knee which appear old. Psychiatric: Mood and affect are normal. Speech and behavior are normal.  ____________________________________________   LABS (all labs ordered are listed, but only abnormal results are displayed)  Labs Reviewed - No data to display ____________________________________________  EKG  ED ECG REPORT I, SUNG,JADE J, the attending physician, personally viewed and interpreted this ECG.   Date: 02/10/2019  EKG Time: 0405  Rate: 96  Rhythm: normal EKG, normal  sinus rhythm  Axis: Normal  Intervals:none  ST&T Change: Nonspecific  ____________________________________________  RADIOLOGY  ED MD interpretation: None  Official radiology report(s): No results found.  ____________________________________________   PROCEDURES  Procedure(s) performed (including Critical Care):  Procedures   ____________________________________________   INITIAL IMPRESSION / ASSESSMENT AND PLAN / ED COURSE  As part of my medical decision making, I reviewed the following data within the electronic MEDICAL RECORD NUMBER Nursing notes reviewed and incorporated, Old chart reviewed and Notes from prior ED visits        33 year old male with a history of polysubstance abuse who presents sensation of bugs crawling over his skin.  Will administer 1 mg oral Ativan.  Head was cleaned and approximately 4 cm linear laceration found which was clean without warmth, erythema, fluctuance or purulence.  As this injury is over 48 hours old, will not staple or suture repair.  It will heal well secondarily.  Patient reports tetanus is up-to-date.  I cautioned patient to quarantine himself during the current coronavirus pandemic.  Strict return precautions given.  Patient verbalizes understanding agrees with plan of care.      ____________________________________________   FINAL CLINICAL IMPRESSION(S) / ED DIAGNOSES  Final diagnoses:  Polysubstance (including opioids) dependence, daily use (HCC)  Skin abnormalities  Minor head injury, initial encounter  Scalp laceration, initial encounter     ED Discharge Orders    None       Note:  This document was prepared using Dragon voice recognition software and may include unintentional dictation errors.   Irean Hong, MD 02/10/19 [id number]

## 2019-02-10 NOTE — Discharge Instructions (Addendum)
Stop using drugs.  These are causing your skin changes and feelings of bugs crawling on your skin.  Isolate yourself indoors as we are currently in a pandemic situation.  Return to the ER for worsening symptoms, persistent vomiting, difficulty breathing or dire emergency.

## 2019-05-29 IMAGING — DX DG ABDOMEN 1V
1 series · 1 of 1 positions shown · non-contrast
Comparison: None.

CLINICAL DATA: 31-year-old who claims to have ingested
approximately 3.5 g of methamphetamine. Patient currently
diaphoretic and having hallucinations.

EXAM:
Portable ABDOMEN - 1 VIEW

[abdomen kub]
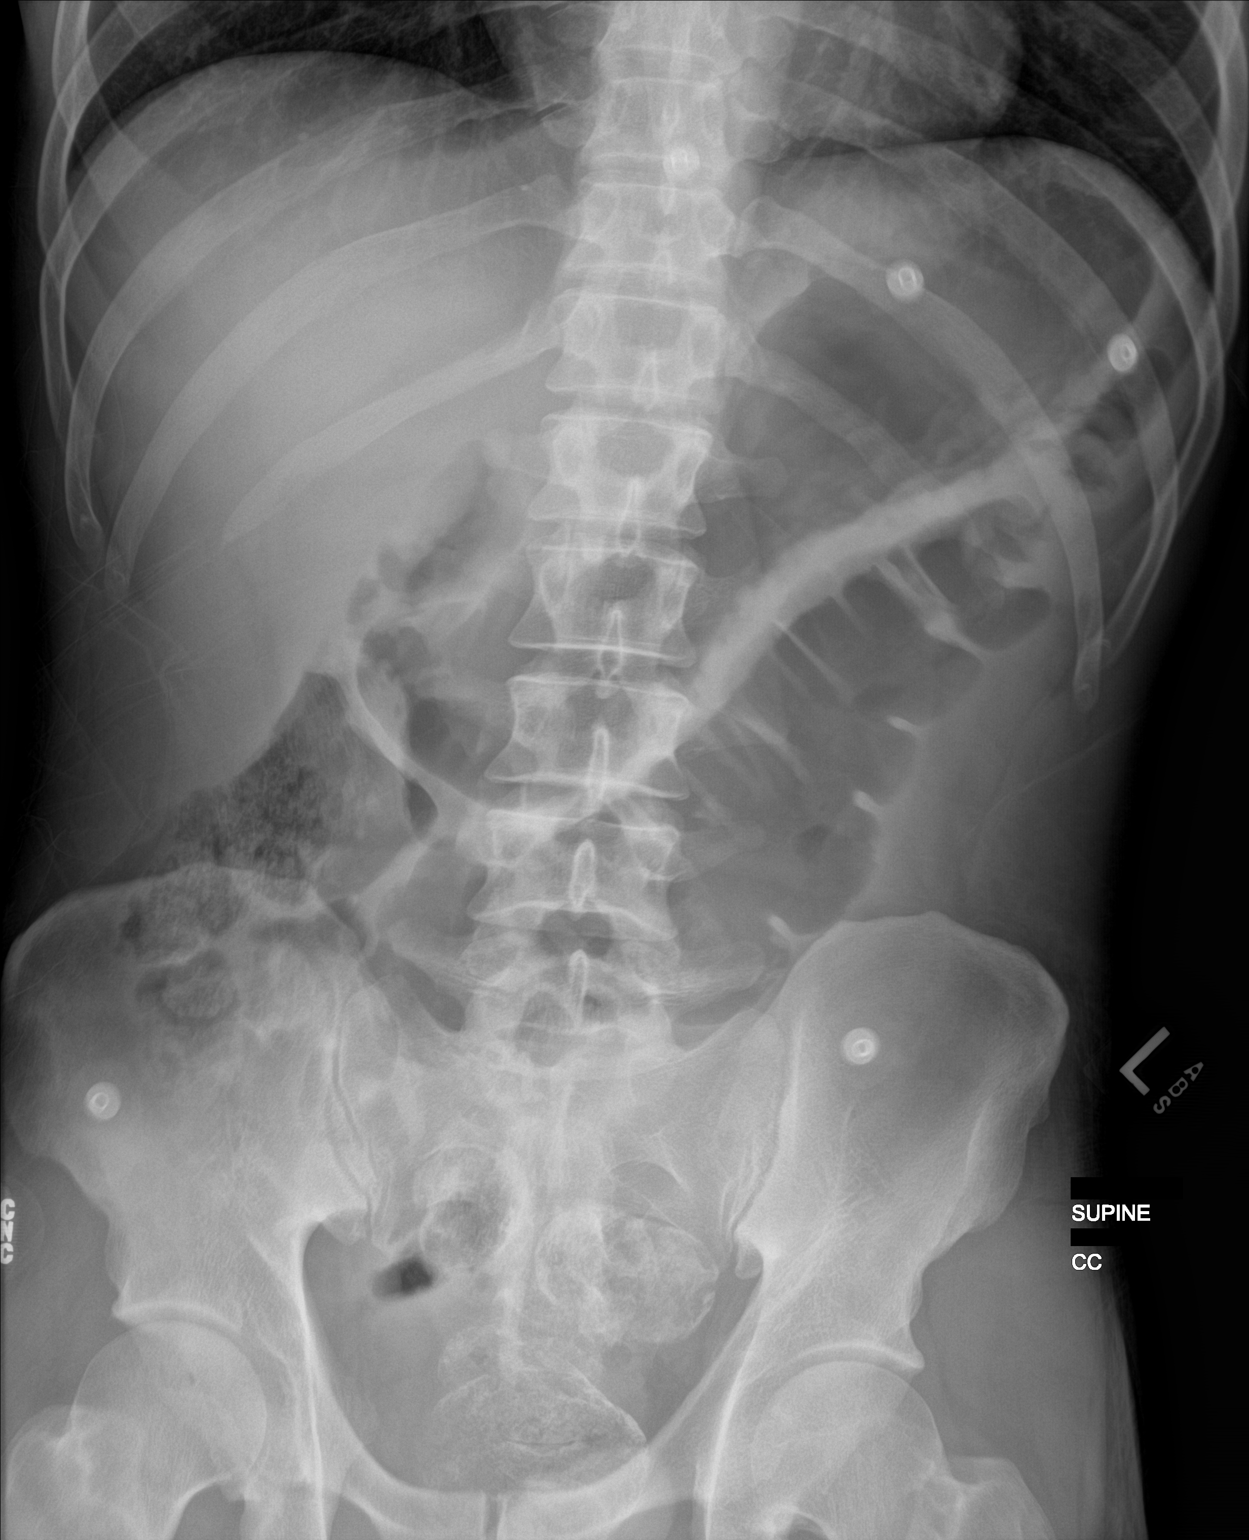

[1 of 1 positions shown; findings below may reference images not displayed]

FINDINGS: Gas within the stomach which is upper normal in size. Gas within
upper normal caliber transverse colon. Opaque stool in the rectum,
likely related to previously ingested material. No opaque foreign
bodies identified in the GI tract. Possible hepatomegaly, as the
liver edge extends below the costal margin. Regional skeleton
intact.
IMPRESSION: 1. No acute abdominal abnormality.
2. No opaque foreign bodies identified in the GI tract.
3. Query hepatomegaly.

## 2024-08-02 ENCOUNTER — Emergency Department (HOSPITAL_COMMUNITY)
Admission: EM | Admit: 2024-08-02 | Discharge: 2024-08-03 | Disposition: A | Discharge status: Home / Self Care | Payer: Self-pay | Attending: Emergency Medicine | Admitting: Emergency Medicine

## 2024-08-02 ENCOUNTER — Emergency Department (HOSPITAL_COMMUNITY): Payer: Self-pay

## 2024-08-02 ENCOUNTER — Other Ambulatory Visit: Payer: Self-pay

## 2024-08-02 DIAGNOSIS — S01112A Laceration without foreign body of left eyelid and periocular area, initial encounter: Secondary | ICD-10-CM | POA: Insufficient documentation

## 2024-08-02 DIAGNOSIS — Z23 Encounter for immunization: Secondary | ICD-10-CM | POA: Insufficient documentation

## 2024-08-02 DIAGNOSIS — R799 Abnormal finding of blood chemistry, unspecified: Secondary | ICD-10-CM | POA: Insufficient documentation

## 2024-08-02 DIAGNOSIS — W06XXXA Fall from bed, initial encounter: Secondary | ICD-10-CM | POA: Insufficient documentation

## 2024-08-02 DIAGNOSIS — S0990XA Unspecified injury of head, initial encounter: Secondary | ICD-10-CM | POA: Insufficient documentation

## 2024-08-02 DIAGNOSIS — M79642 Pain in left hand: Secondary | ICD-10-CM | POA: Insufficient documentation

## 2024-08-02 DIAGNOSIS — M25532 Pain in left wrist: Secondary | ICD-10-CM | POA: Insufficient documentation

## 2024-08-02 LAB — CBC WITH DIFFERENTIAL/PLATELET
Abs Immature Granulocytes: 0.02 K/uL (ref 0.00–0.07)
Basophils Absolute: 0 K/uL (ref 0.0–0.1)
Basophils Relative: 1 %
Eosinophils Absolute: 0.1 K/uL (ref 0.0–0.5)
Eosinophils Relative: 2 %
HCT: 37.7 % — ABNORMAL LOW (ref 39.0–52.0)
Hemoglobin: 13.1 g/dL (ref 13.0–17.0)
Immature Granulocytes: 0 %
Lymphocytes Relative: 30 %
Lymphs Abs: 2.1 K/uL (ref 0.7–4.0)
MCH: 30.9 pg (ref 26.0–34.0)
MCHC: 34.7 g/dL (ref 30.0–36.0)
MCV: 88.9 fL (ref 80.0–100.0)
Monocytes Absolute: 0.6 K/uL (ref 0.1–1.0)
Monocytes Relative: 8 %
Neutro Abs: 4.1 K/uL (ref 1.7–7.7)
Neutrophils Relative %: 59 %
Platelets: 193 K/uL (ref 150–400)
RBC: 4.24 MIL/uL (ref 4.22–5.81)
RDW: 11.9 % (ref 11.5–15.5)
WBC: 6.9 K/uL (ref 4.0–10.5)
nRBC: 0 % (ref 0.0–0.2)

## 2024-08-02 LAB — BASIC METABOLIC PANEL WITH GFR
Anion gap: 10 (ref 5–15)
BUN: 18 mg/dL (ref 6–20)
CO2: 22 mmol/L (ref 22–32)
Calcium: 8.9 mg/dL (ref 8.9–10.3)
Chloride: 108 mmol/L (ref 98–111)
Creatinine, Ser: 0.98 mg/dL (ref 0.61–1.24)
GFR, Estimated: >60 mL/min (ref >60)
Glucose, Bld: 124 mg/dL — ABNORMAL HIGH (ref 70–99)
Potassium: 3.6 mmol/L (ref 3.5–5.1)
Sodium: 140 mmol/L (ref 135–145)

## 2024-08-02 LAB — CBG MONITORING, ED: Glucose-Capillary: 123 mg/dL — ABNORMAL HIGH (ref 70–99)

## 2024-08-02 MED ORDER — ACETAMINOPHEN 500 MG PO TABS
1000.0000 mg | ORAL_TABLET | Freq: Once | ORAL | Status: AC
  Administered 2024-08-02: 1000 mg via ORAL
  Filled 2024-08-02: qty 2

## 2024-08-02 MED ORDER — LIDOCAINE-EPINEPHRINE (PF) 2 %-1:200000 IJ SOLN
20.0000 mL | Freq: Once | INTRAMUSCULAR | Status: AC
  Administered 2024-08-02: 20 mL
  Filled 2024-08-02: qty 20

## 2024-08-02 MED ORDER — TETANUS-DIPHTH-ACELL PERTUSSIS 5-2.5-18.5 LF-MCG/0.5 IM SUSY
0.5000 mL | PREFILLED_SYRINGE | Freq: Once | INTRAMUSCULAR | Status: AC
  Administered 2024-08-02: 0.5 mL via INTRAMUSCULAR

## 2024-08-02 NOTE — ED Notes (Signed)
 Patient transported to CT

## 2024-08-02 NOTE — ED Provider Notes (Signed)
 [facility]PT [facility] Emergency Department Provider Note MRN:  [id number]  Arrival date & time: 08/03/24     Chief Complaint   Laceration   History of Present Illness   Ronald Wood is a 38 y.o. year-old male presents to the ED with chief complaint of fall from bunk bed.  Patient is brought in [facility] jail.  The fall was approximately 4 feet.  He hit his head and sustained a laceration to his left eyebrow.  Bleeding is controlled prior to arrival.  He is not anticoagulated.  He also complains of left hand/wrist pain.  Denies any other injuries.  Denies any vision or speech changes..  History provided by patient.   Review of Systems  Pertinent positive and negative review of systems noted in HPI.    Physical Exam   Vitals:   08/03/24 0045 08/03/24 0055  BP: 105/78   Pulse: (!) 49   Resp: 13   Temp:  (!) 97.5 F (36.4 C)  SpO2: 99%     CONSTITUTIONAL:  non toxic-appearing, NAD NEURO:  Alert and oriented x 3, CN 3-12 grossly intact EYES:  eyes equal and reactive ENT/NECK:  Supple, no stridor, gaping left eyebrow laceration as pictured CARDIO:  normal rate, regular rhythm, appears well-perfused, intact distal pulses PULM:  No respiratory distress, CTAB GI/GU:  non-distended,  MSK/SPINE:  No gross deformities, no edema, moves all extremities, no TTP or deformity of the left wrist SKIN:  no rash,   *Additional and/or pertinent findings included in MDM below  Diagnostic and Interventional Summary    EKG Interpretation Date/Time:    Ventricular Rate:    PR Interval:    QRS Duration:    QT Interval:    QTC Calculation:   R Axis:      Text Interpretation:         Labs Reviewed  CBC WITH DIFFERENTIAL/PLATELET - Abnormal; Notable for the following components:      Result Value   HCT 37.7 (*)    All other components within normal limits  BASIC METABOLIC PANEL WITH GFR - Abnormal; Notable for the following components:   Glucose,  Bld 124 (*)    All other components within normal limits  CBG MONITORING, ED - Abnormal; Notable for the following components:   Glucose-Capillary 123 (*)    All other components within normal limits    CT HEAD WO CONTRAST ( )  Final Result    CT Maxillofacial Wo Contrast  Final Result    CT Cervical Spine Wo Contrast  Final Result    DG Hand Complete Left  Final Result    DG Wrist Complete Left  Final Result      Medications  lidocaine -EPINEPHrine  (XYLOCAINE  W/EPI) 2 %-1:200000 (PF) injection 20 mL (20 mLs Infiltration Given 08/02/24 2321)  Tdap (BOOSTRIX) injection 0.5 mL (0.5 mLs Intramuscular Given 08/02/24 2252)  acetaminophen  (TYLENOL ) tablet 1,000 mg (1,000 mg Oral Given 08/02/24 2321)  cephALEXin  (KEFLEX ) capsule 500 mg (500 mg Oral Given 08/03/24 0055)     Procedures  /  Critical Care .Laceration Repair  Date/Time: 08/03/2024 12:24 AM  Performed by: Vicky Charleston, PA-C Authorized by: Vicky Charleston, PA-C   Consent:    Consent obtained:  Verbal   Consent given by:  Patient   Risks, benefits, and alternatives were discussed: yes     Risks discussed:  Pain, poor cosmetic result, poor wound healing, need for additional repair, nerve damage, vascular damage and infection   Alternatives  discussed:  No treatment Universal protocol:    Procedure explained and questions answered to patient or proxy's satisfaction: yes     Relevant documents present and verified: yes     Test results available: yes     Imaging studies available: yes     Required blood products, implants, devices, and special equipment available: yes     Site/side marked: yes     Immediately prior to procedure, a time out was called: yes     Patient identity confirmed:  Verbally with patient Anesthesia:    Anesthesia method:  Local infiltration   Local anesthetic:  Lidocaine  1% WITH epi Laceration details:    Location:  Face   Face location:  L eyebrow (and left upper eyelid)   Length (cm):   4 Exploration:    Imaging outcome: foreign body not noted     Contaminated: no   Treatment:    Area cleansed with:  Saline   Amount of cleaning:  Standard   Irrigation solution:  Sterile saline Skin repair:    Repair method:  Sutures   Suture size:  5-0   Wound skin closure material used: vicryl.   Suture technique:  Simple interrupted   Number of sutures:  13 Approximation:    Approximation:  Close Repair type:    Repair type:  Simple Post-procedure details:    Dressing:  Antibiotic ointment   Procedure completion:  Tolerated well, no immediate complications   ED Course and Medical Decision Making  I have reviewed the triage vital signs, the nursing notes, and pertinent available records from the EMR.  Social Determinants Affecting Complexity of Care: Patient has no clinically significant social determinants affecting this chief complaint..   ED Course:    Medical Decision Making Patient here after having a fall off of his bunk while in jail.  He states that he became swimmy headed and fell off striking his head.  He sustained a laceration to his left eyebrow.  He denies passing out.  CT scan showed no fracture.  Laceration was repaired at bedside.  We discussed laceration repair care and follow-up and potential options for scar revision at a future point in time if needed/desired.  Labs are reassuring.  No significant electrolyte abnormality.  No anemia.  Amount and/or Complexity of Data Reviewed Labs: ordered. Radiology: ordered.  Risk OTC drugs. Prescription drug management.         Consultants: No consultations were needed in caring for this patient.   Treatment and Plan: I considered admission due to patient's initial presentation, but after considering the examination and diagnostic results, patient will not require admission and can be discharged with outpatient follow-up.    Final Clinical Impressions(s) / ED Diagnoses     ICD-10-CM   1.  Injury of head, initial encounter  S09.90XA     2. Laceration of left eyebrow, initial encounter  D98.887J       ED Discharge Orders          Ordered    cephALEXin  (KEFLEX ) 500 MG capsule  2 times daily        08/03/24 0048              Discharge Instructions Discussed with and Provided to Patient:   Discharge Instructions   None      Vicky Charleston, PA-C 08/03/24 [id number]    Raford Lenis, MD 08/03/24 [id number]

## 2024-08-02 NOTE — ED Triage Notes (Signed)
 Pt BIB [facility] from jail. Pt fell out of his bunk and fell about 4 feet and hit his head, laceration to the left eyebrow. EMS reports laceration is deep and bleeding upon arrival. Pressure dressing applied and bleeding is controlled. GCS 15. No blood thinners.   EMS 20R Hand 48 P 118SBP

## 2024-08-03 MED ORDER — CEPHALEXIN 250 MG PO CAPS
500.0000 mg | ORAL_CAPSULE | Freq: Once | ORAL | Status: AC
  Administered 2024-08-03: 500 mg via ORAL
  Filled 2024-08-03: qty 2

## 2024-08-03 MED ORDER — CEPHALEXIN 500 MG PO CAPS: 500.0000 mg | ORAL_CAPSULE | Freq: Two times a day (BID) | ORAL | 0 refills | Status: AC
